# Patient Record
Sex: Female | Born: 1975 | Race: White | Hispanic: No | Marital: Married | Smoking: Never smoker
Health system: Southern US, Community
[De-identification: ages and names within clinical notes are randomized; demographics above are authoritative.]

## PROBLEM LIST (undated history)

## (undated) ENCOUNTER — Ambulatory Visit

## (undated) HISTORY — PX: OTHER SURGICAL HISTORY: SHX169

---

## 2002-07-08 ENCOUNTER — Other Ambulatory Visit: Admission: RE | Admit: 2002-07-08 | Discharge: 2002-07-08 | Payer: Self-pay | Admitting: *Deleted

## 2002-07-24 ENCOUNTER — Encounter: Payer: Self-pay | Admitting: *Deleted

## 2002-07-24 ENCOUNTER — Ambulatory Visit (HOSPITAL_COMMUNITY): Admission: RE | Admit: 2002-07-24 | Discharge: 2002-07-24 | Payer: Self-pay | Admitting: *Deleted

## 2007-05-23 ENCOUNTER — Ambulatory Visit (HOSPITAL_COMMUNITY): Admission: RE | Admit: 2007-05-23 | Discharge: 2007-05-23 | Payer: Self-pay | Admitting: Specialist

## 2007-06-19 ENCOUNTER — Ambulatory Visit (HOSPITAL_COMMUNITY): Admission: RE | Admit: 2007-06-19 | Discharge: 2007-06-19 | Payer: Self-pay | Admitting: Specialist

## 2016-01-08 DIAGNOSIS — B977 Papillomavirus as the cause of diseases classified elsewhere: Secondary | ICD-10-CM | POA: Insufficient documentation

## 2016-01-08 DIAGNOSIS — N879 Dysplasia of cervix uteri, unspecified: Secondary | ICD-10-CM | POA: Insufficient documentation

## 2016-01-08 DIAGNOSIS — G4700 Insomnia, unspecified: Secondary | ICD-10-CM | POA: Insufficient documentation

## 2016-01-08 DIAGNOSIS — F3340 Major depressive disorder, recurrent, in remission, unspecified: Secondary | ICD-10-CM | POA: Insufficient documentation

## 2016-07-08 DIAGNOSIS — E282 Polycystic ovarian syndrome: Secondary | ICD-10-CM | POA: Insufficient documentation

## 2017-11-02 ENCOUNTER — Other Ambulatory Visit: Payer: Self-pay | Admitting: Obstetrics and Gynecology

## 2017-11-02 DIAGNOSIS — R928 Other abnormal and inconclusive findings on diagnostic imaging of breast: Secondary | ICD-10-CM

## 2017-11-07 ENCOUNTER — Other Ambulatory Visit: Payer: Self-pay | Admitting: Obstetrics and Gynecology

## 2017-11-07 ENCOUNTER — Ambulatory Visit
Admission: RE | Admit: 2017-11-07 | Discharge: 2017-11-07 | Disposition: A | Discharge status: Home / Self Care | Payer: BC Managed Care – PPO | Source: Ambulatory Visit | Attending: Obstetrics and Gynecology | Admitting: Obstetrics and Gynecology

## 2017-11-07 DIAGNOSIS — R928 Other abnormal and inconclusive findings on diagnostic imaging of breast: Secondary | ICD-10-CM

## 2017-11-07 DIAGNOSIS — N6489 Other specified disorders of breast: Secondary | ICD-10-CM

## 2018-05-11 ENCOUNTER — Ambulatory Visit: Admission: RE | Admit: 2018-05-11 | Payer: Self-pay | Source: Ambulatory Visit

## 2018-05-11 ENCOUNTER — Encounter: Payer: Self-pay | Admitting: Radiology

## 2018-05-11 ENCOUNTER — Ambulatory Visit
Admission: RE | Admit: 2018-05-11 | Discharge: 2018-05-11 | Disposition: A | Discharge status: Home / Self Care | Payer: BC Managed Care – PPO | Source: Ambulatory Visit | Attending: Obstetrics and Gynecology | Admitting: Obstetrics and Gynecology

## 2018-05-11 DIAGNOSIS — N6489 Other specified disorders of breast: Secondary | ICD-10-CM

## 2019-08-20 DIAGNOSIS — H00022 Hordeolum internum right lower eyelid: Secondary | ICD-10-CM | POA: Insufficient documentation

## 2020-01-09 ENCOUNTER — Other Ambulatory Visit: Payer: Self-pay | Admitting: Obstetrics and Gynecology

## 2020-01-09 DIAGNOSIS — Z1231 Encounter for screening mammogram for malignant neoplasm of breast: Secondary | ICD-10-CM

## 2020-01-29 ENCOUNTER — Ambulatory Visit
Admission: RE | Admit: 2020-01-29 | Discharge: 2020-01-29 | Disposition: A | Discharge status: Home / Self Care | Payer: BC Managed Care – PPO | Source: Ambulatory Visit | Attending: Obstetrics and Gynecology | Admitting: Obstetrics and Gynecology

## 2020-01-29 ENCOUNTER — Other Ambulatory Visit: Payer: Self-pay

## 2020-01-29 DIAGNOSIS — Z1231 Encounter for screening mammogram for malignant neoplasm of breast: Secondary | ICD-10-CM

## 2021-03-09 ENCOUNTER — Other Ambulatory Visit: Payer: Self-pay | Admitting: Obstetrics and Gynecology

## 2021-03-09 DIAGNOSIS — Z1231 Encounter for screening mammogram for malignant neoplasm of breast: Secondary | ICD-10-CM

## 2021-03-19 DIAGNOSIS — G9332 Myalgic encephalomyelitis/chronic fatigue syndrome: Secondary | ICD-10-CM | POA: Insufficient documentation

## 2021-04-15 ENCOUNTER — Ambulatory Visit
Admission: RE | Admit: 2021-04-15 | Discharge: 2021-04-15 | Disposition: A | Discharge status: Home / Self Care | Payer: BC Managed Care – PPO | Source: Ambulatory Visit | Attending: Obstetrics and Gynecology | Admitting: Obstetrics and Gynecology

## 2021-04-15 ENCOUNTER — Other Ambulatory Visit: Payer: Self-pay

## 2021-04-15 DIAGNOSIS — Z1231 Encounter for screening mammogram for malignant neoplasm of breast: Secondary | ICD-10-CM

## 2022-03-02 ENCOUNTER — Other Ambulatory Visit: Payer: Self-pay | Admitting: Obstetrics and Gynecology

## 2022-03-02 DIAGNOSIS — Z1231 Encounter for screening mammogram for malignant neoplasm of breast: Secondary | ICD-10-CM

## 2022-04-18 ENCOUNTER — Ambulatory Visit
Admission: RE | Admit: 2022-04-18 | Discharge: 2022-04-18 | Disposition: A | Discharge status: Home / Self Care | Payer: BC Managed Care – PPO | Source: Ambulatory Visit | Attending: Obstetrics and Gynecology | Admitting: Obstetrics and Gynecology

## 2022-04-18 DIAGNOSIS — Z1231 Encounter for screening mammogram for malignant neoplasm of breast: Secondary | ICD-10-CM

## 2022-12-15 ENCOUNTER — Encounter: Payer: Self-pay | Admitting: Internal Medicine

## 2022-12-16 DIAGNOSIS — M25561 Pain in right knee: Secondary | ICD-10-CM | POA: Insufficient documentation

## 2023-01-06 ENCOUNTER — Ambulatory Visit (AMBULATORY_SURGERY_CENTER): Payer: BC Managed Care – PPO

## 2023-01-06 ENCOUNTER — Encounter: Payer: Self-pay | Admitting: Internal Medicine

## 2023-01-06 VITALS — Ht 64.0 in | Wt 184.0 lb

## 2023-01-06 DIAGNOSIS — Z1211 Encounter for screening for malignant neoplasm of colon: Secondary | ICD-10-CM

## 2023-01-06 MED ORDER — NA SULFATE-K SULFATE-MG SULF 17.5-3.13-1.6 GM/177ML PO SOLN: 1.0000 | Freq: Once | ORAL | 0 refills | Status: AC

## 2023-01-06 NOTE — Progress Notes (Signed)
No egg or soy allergy known to patient  No issues known to pt with past sedation with any surgeries or procedures Patient denies ever being told they had issues or difficulty with intubation  No FH of Malignant Hyperthermia Pt is not on diet pills Pt is not on  home 02  Pt is not on blood thinners  Pt denies issues with constipation  No A fib or A flutter Have any cardiac testing pending--no  LOA: independent Prep: suprep   Patient's chart reviewed by John Nulty CNRA prior to previsit and patient appropriate for the [facility].  Previsit completed and red dot placed by patient's name on their procedure day (on provider's schedule).     PV competed with patient. Prep instructions sent via mychart and home address. Goodrx coupon for [facility] provided to use for price reduction if needed.  

## 2023-01-17 ENCOUNTER — Ambulatory Visit (AMBULATORY_SURGERY_CENTER): Payer: BC Managed Care – PPO | Admitting: Internal Medicine

## 2023-01-17 ENCOUNTER — Encounter: Payer: Self-pay | Admitting: Internal Medicine

## 2023-01-17 VITALS — BP 92/60 | HR 60 | Temp 97.5°F | Resp 20 | Ht 64.0 in | Wt 184.0 lb

## 2023-01-17 DIAGNOSIS — Z1211 Encounter for screening for malignant neoplasm of colon: Secondary | ICD-10-CM | POA: Diagnosis not present

## 2023-01-17 DIAGNOSIS — D123 Benign neoplasm of transverse colon: Secondary | ICD-10-CM

## 2023-01-17 MED ORDER — SODIUM CHLORIDE 0.9 % IV SOLN: 500.0000 mL | Freq: Once | INTRAVENOUS | Status: DC

## 2023-01-17 NOTE — Progress Notes (Signed)
 Called to room to assist during endoscopic procedure.  Patient ID and intended procedure confirmed with present staff. Received instructions for my participation in the procedure from the performing physician.  

## 2023-01-17 NOTE — Progress Notes (Signed)
 A and O x3. Report to RN. Tolerated MAC anesthesia well.

## 2023-01-17 NOTE — Op Note (Signed)
[facility] Patient Name: Meghan King Procedure Date: 01/17/2023 2:21 PM MRN: [id number] Endoscopist: Madelyn Brunner [place] , , [id number] Age: 47 Referring MD:  Date of Birth: 03-07-1976 Gender: Female Account #: [id number] Procedure:                Colonoscopy Indications:              Screening for colorectal malignant neoplasm, This                            is the patient's first colonoscopy Medicines:                Monitored Anesthesia Care Procedure:                Pre-Anesthesia Assessment:                           - Prior to the procedure, a History and Physical                            was performed, and patient medications and                            allergies were reviewed. The patient's tolerance of                            previous anesthesia was also reviewed. The risks                            and benefits of the procedure and the sedation                            options and risks were discussed with the patient.                            All questions were answered, and informed consent                            was obtained. Prior Anticoagulants: The patient has                            taken no anticoagulant or antiplatelet agents. ASA                            Grade Assessment: II - A patient with mild systemic                            disease. After reviewing the risks and benefits,                            the patient was deemed in satisfactory condition to                            undergo the procedure.  After obtaining informed consent, the colonoscope                            was passed under direct vision. Throughout the                            procedure, the patient's blood pressure, pulse, and                            oxygen saturations were monitored continuously. The                            Olympus CF-HQ190L [id number]) Colonoscope was                            introduced through  the anus and advanced to the the                            terminal ileum. The colonoscopy was performed                            without difficulty. The patient tolerated the                            procedure well. The quality of the bowel                            preparation was good. The terminal ileum, ileocecal                            valve, appendiceal orifice, and rectum were                            photographed. Scope In: 2:25:01 PM Scope Out: 2:40:05 PM Scope Withdrawal Time: 0 hours 12 minutes 23 seconds  Total Procedure Duration: 0 hours 15 minutes 4 seconds  Findings:                 The terminal ileum appeared normal.                           A 3 mm polyp was found in the transverse colon. The                            polyp was sessile. The polyp was removed with a                            cold snare. Resection and retrieval were complete.                           Non-bleeding internal hemorrhoids were found during                            retroflexion. Complications:  No immediate complications. Estimated Blood Loss:     Estimated blood loss was minimal. Impression:               - The examined portion of the ileum was normal.                           - One 3 mm polyp in the transverse colon, removed                            with a cold snare. Resected and retrieved.                           - Non-bleeding internal hemorrhoids. Recommendation:           - Discharge patient to home (with escort).                           - Await pathology results.                           - The findings and recommendations were discussed                            with the patient. Dr Particia Lather "Alan Ripper" [place],  01/17/2023 2:42:09 PM

## 2023-01-17 NOTE — Progress Notes (Signed)
   GASTROENTEROLOGY PROCEDURE H&P NOTE   Primary Care Physician: Olive Bass, MD    Reason for Procedure:   Colon cancer screening  Plan:    Colonoscopy  Patient is appropriate for endoscopic procedure(s) in the ambulatory ([facility]) setting.  The nature of the procedure, as well as the risks, benefits, and alternatives were carefully and thoroughly reviewed with the patient. Ample time for discussion and questions allowed. The patient understood, was satisfied, and agreed to proceed.     HPI: Meghan King is a 47 y.o. female who presents for colonoscopy for colon cancer screening. Denies blood in stools, changes in bowel habits, or unintentional weight loss. Denies family history of colon cancer.  History reviewed. No pertinent past medical history.  Past Surgical History:  Procedure Laterality Date   CESAREAN SECTION     Ovarian wedge resection       Prior to Admission medications   Medication Sig Start Date End Date Taking? Authorizing Provider  estrogen, conjugated,-medroxyprogesterone (PREMPRO) 0.3-1.5 MG tablet Take 1 tablet by mouth daily. Patient not taking: Reported on 01/06/2023 12/28/21   [provider]    Current Outpatient Medications  Medication Sig Dispense Refill   estrogen, conjugated,-medroxyprogesterone (PREMPRO) 0.3-1.5 MG tablet Take 1 tablet by mouth daily. (Patient not taking: Reported on 01/06/2023)     Current Facility-Administered Medications  Medication Dose Route Frequency Provider Last Rate Last Admin   0.9 %  sodium chloride infusion  500 mL Intravenous Once Imogene Burn, MD        Allergies as of 01/17/2023   (No Known Allergies)    Family History  Problem Relation Age of Onset   Colon polyps Mother    Breast cancer Neg Hx    Colon cancer Neg Hx    Esophageal cancer Neg Hx    Rectal cancer Neg Hx    Stomach cancer Neg Hx     Social History   Socioeconomic History   Marital status: Married    Spouse name: Not on  file   Number of children: Not on file   Years of education: Not on file   Highest education level: Not on file  Occupational History   Not on file  Tobacco Use   Smoking status: Never   Smokeless tobacco: Never  Vaping Use   Vaping status: Never Used  Substance and Sexual Activity   Alcohol use: Yes    Comment: social   Drug use: Never   Sexual activity: Yes  Other Topics Concern   Not on file  Social History Narrative   Not on file   Social Determinants of Health   Financial Resource Strain: Not on file  Food Insecurity: Not on file  Transportation Needs: Not on file  Physical Activity: Not on file  Stress: Not on file  Social Connections: Not on file  Intimate Partner Violence: Not on file    Physical Exam: Vital signs in last 24 hours: BP 115/70   Pulse 65   Temp (!) 97.5 F (36.4 C) (Temporal)   Ht 5\' 4"  (1.626 m)   Wt 184 lb (83.5 kg)   SpO2 99%   BMI 31.58 kg/m  GEN: NAD EYE: Sclerae anicteric ENT: MMM CV: Non-tachycardic Pulm: No increased work of breathing GI: Soft, NT/ND NEURO:  Alert & Oriented   Eulah Pont, MD [facility]  01/17/2023 2:12 PM

## 2023-01-17 NOTE — Patient Instructions (Addendum)
Pathology results will be ready in approximately 7-10 days. Next colonoscopy will be scheduled based on what pathology shows.  YOU HAD AN ENDOSCOPIC PROCEDURE TODAY AT THE [facility]:   Refer to the procedure report that was given to you for any specific questions about what was found during the examination.  If the procedure report does not answer your questions, please call your gastroenterologist to clarify.  If you requested that your care partner not be given the details of your procedure findings, then the procedure report has been included in a sealed envelope for you to review at your convenience later.  YOU SHOULD EXPECT: Some feelings of bloating in the abdomen. Passage of more gas than usual.  Walking can help get rid of the air that was put into your GI tract during the procedure and reduce the bloating. If you had a lower endoscopy (such as a colonoscopy or flexible sigmoidoscopy) you may notice spotting of blood in your stool or on the toilet paper. If you underwent a bowel prep for your procedure, you may not have a normal bowel movement for a few days.  Please Note:  You might notice some irritation and congestion in your nose or some drainage.  This is from the oxygen used during your procedure.  There is no need for concern and it should clear up in a day or so.  SYMPTOMS TO REPORT IMMEDIATELY:  Following lower endoscopy (colonoscopy or flexible sigmoidoscopy):  Excessive amounts of blood in the stool  Significant tenderness or worsening of abdominal pains  Swelling of the abdomen that is new, acute  Fever of 100F or higher  For urgent or emergent issues, a gastroenterologist can be reached at any hour by calling [phone]. Do not use MyChart messaging for urgent concerns.    DIET:  We do recommend a small meal at first, but then you may proceed to your regular diet.  Drink plenty of fluids but you should avoid alcoholic beverages for 24 hours.  ACTIVITY:   You should plan to take it easy for the rest of today and you should NOT DRIVE or use heavy machinery until tomorrow (because of the sedation medicines used during the test).    FOLLOW UP: Our staff will call the number listed on your records the next business day following your procedure.  We will call around 7:15- 8:00 am to check on you and address any questions or concerns that you may have regarding the information given to you following your procedure. If we do not reach you, we will leave a message.     If any biopsies were taken you will be contacted by phone or by letter within the next 1-3 weeks.  Please call us at [phone] if you have not heard about the biopsies in 3 weeks.    SIGNATURES/CONFIDENTIALITY: You and/or your care partner have signed paperwork which will be entered into your electronic medical record.  These signatures attest to the fact that that the information above on your After Visit Summary has been reviewed and is understood.  Full responsibility of the confidentiality of this discharge information lies with you and/or your care-partner.

## 2023-01-17 NOTE — Progress Notes (Signed)
VS completed by RY  Pt's states no medical or surgical changes since previsit or office visit.

## 2023-01-18 ENCOUNTER — Telehealth: Payer: Self-pay

## 2023-01-18 NOTE — Telephone Encounter (Signed)
  Follow up Call-     01/17/2023    1:42 PM  Call back number  Post procedure Call Back phone  # [phone]  Permission to leave phone message Yes     Patient questions:  Do you have a fever, pain , or abdominal swelling? No. Pain Score  0 *  Have you tolerated food without any problems? Yes.    Have you been able to return to your normal activities? Yes.    Do you have any questions about your discharge instructions: Diet   No. Medications  No. Follow up visit  No.  Do you have questions or concerns about your Care? No.  Actions: * If pain score is 4 or above: No action needed, pain <4.

## 2023-01-23 ENCOUNTER — Encounter: Payer: Self-pay | Admitting: Internal Medicine

## 2023-03-07 ENCOUNTER — Other Ambulatory Visit: Payer: Self-pay | Admitting: Obstetrics and Gynecology

## 2023-03-07 DIAGNOSIS — Z1231 Encounter for screening mammogram for malignant neoplasm of breast: Secondary | ICD-10-CM

## 2023-04-20 ENCOUNTER — Ambulatory Visit
Admission: RE | Admit: 2023-04-20 | Discharge: 2023-04-20 | Disposition: A | Discharge status: Home / Self Care | Payer: BC Managed Care – PPO | Source: Ambulatory Visit | Attending: Obstetrics and Gynecology | Admitting: Obstetrics and Gynecology

## 2023-04-20 DIAGNOSIS — Z1231 Encounter for screening mammogram for malignant neoplasm of breast: Secondary | ICD-10-CM

## 2023-07-04 IMAGING — MG MM DIGITAL SCREENING BILAT W/ TOMO AND CAD
8 series · 8 of 24 positions shown · non-contrast
Comparison: Previous exam(s).

CLINICAL DATA: Screening.

EXAM:
DIGITAL SCREENING BILATERAL MAMMOGRAM WITH TOMOSYNTHESIS AND CAD
TECHNIQUE: Bilateral screening digital craniocaudal and mediolateral oblique
mammograms were obtained. Bilateral screening digital breast
tomosynthesis was performed. The images were evaluated with
computer-aided detection.

[R CC synth-2D]
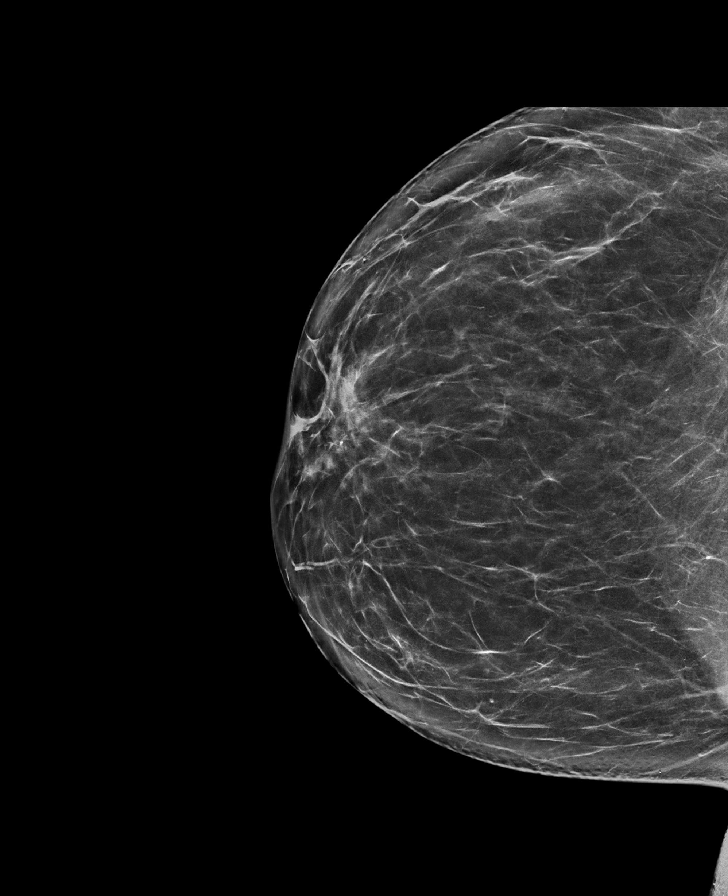

[L CC synth-2D]
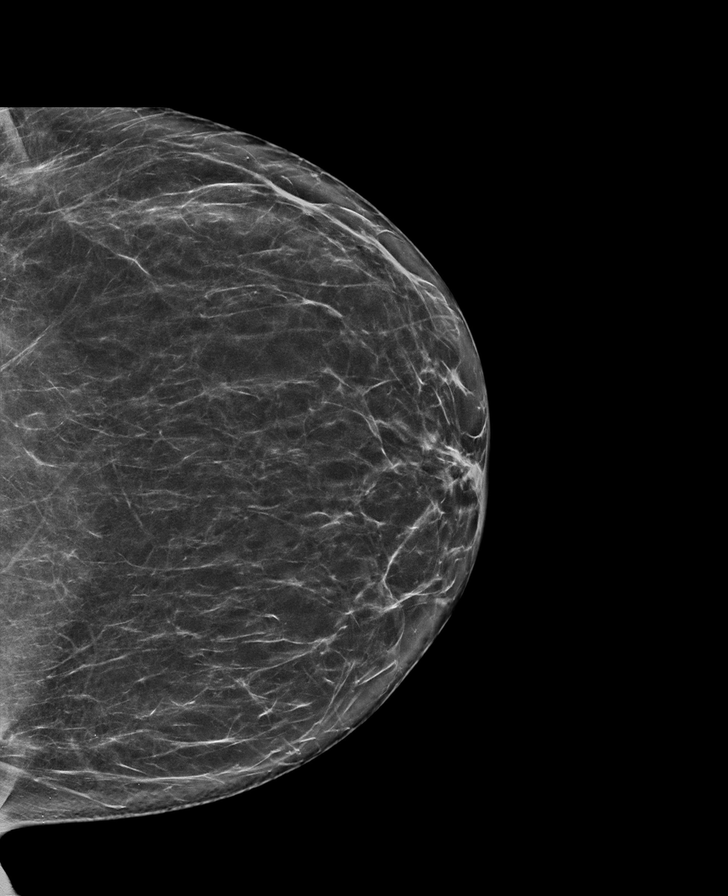

[L MLO synth-2D]
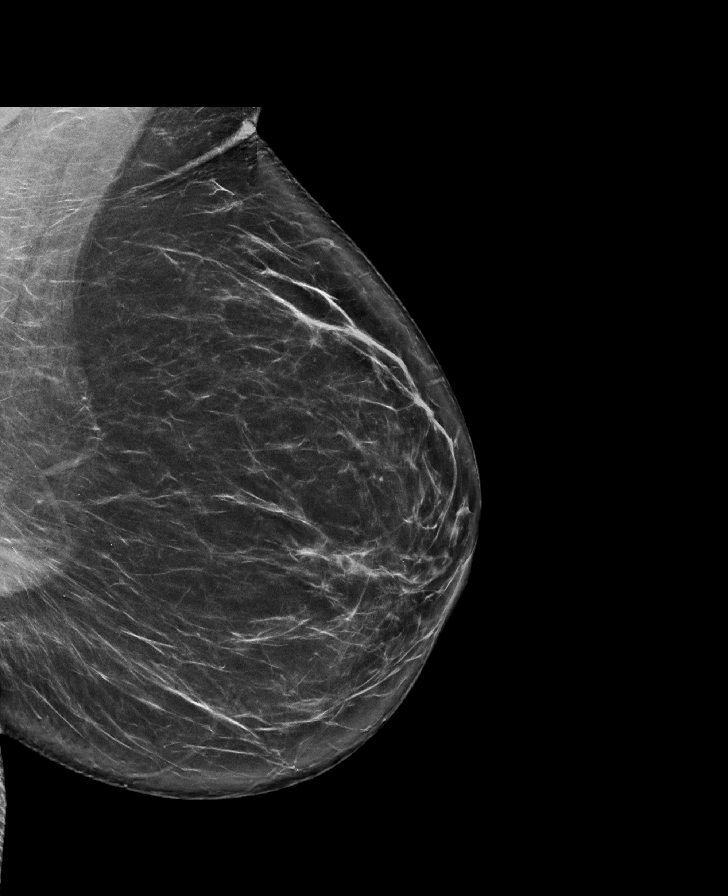

[R MLO synth-2D]
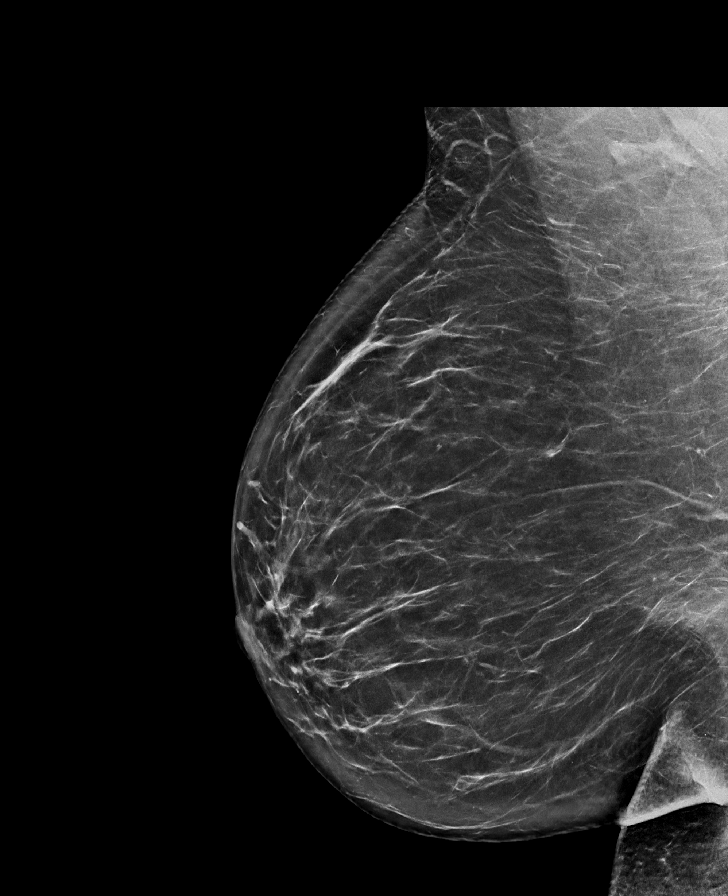

[R CC tomo · tomo slice 40/79.0]
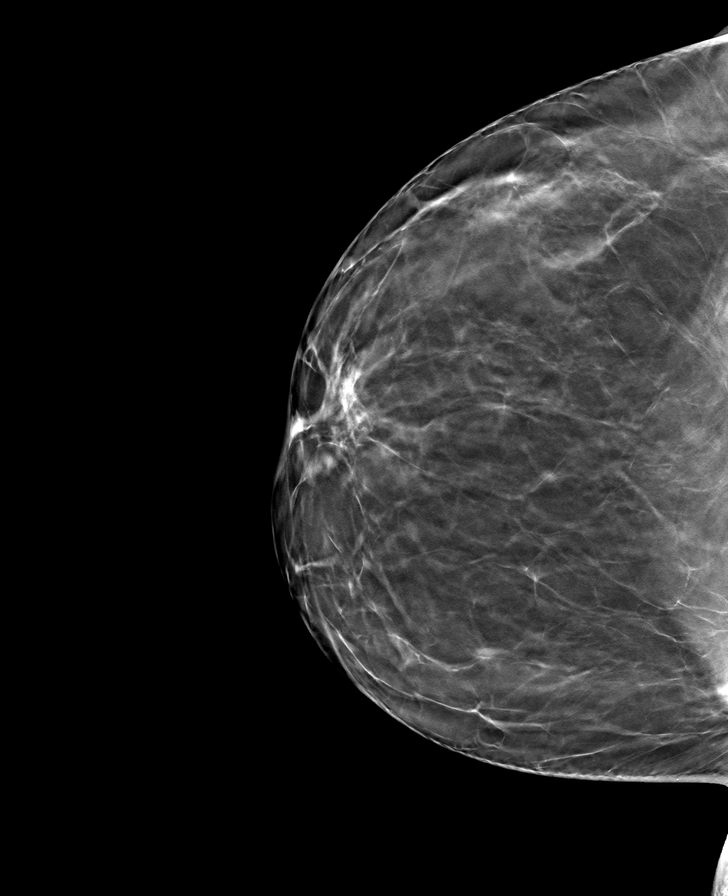

[L CC tomo · tomo slice 39/77.0]
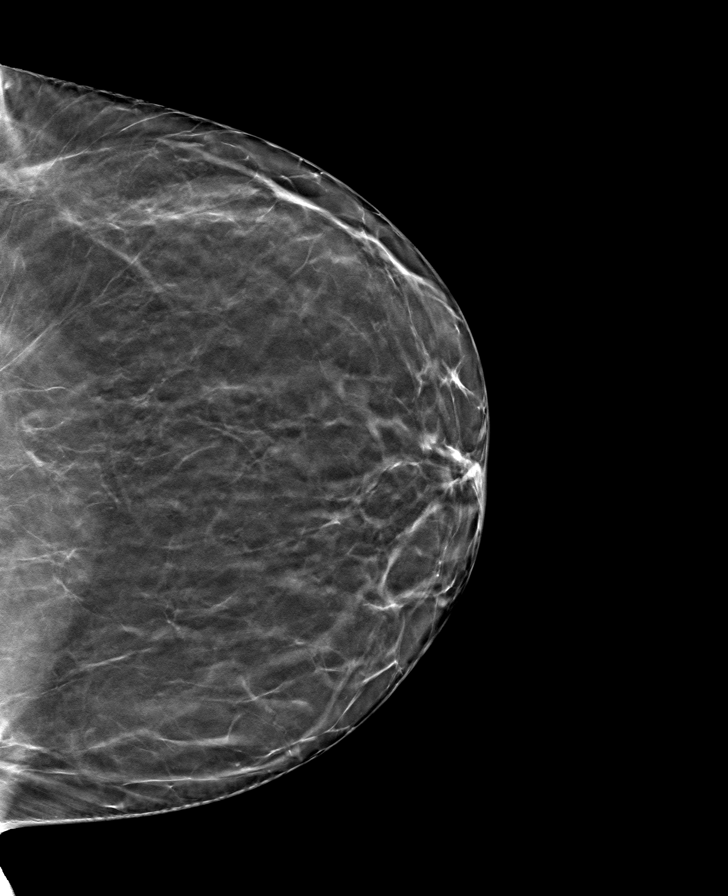

[L MLO tomo · tomo slice 45/89.0]
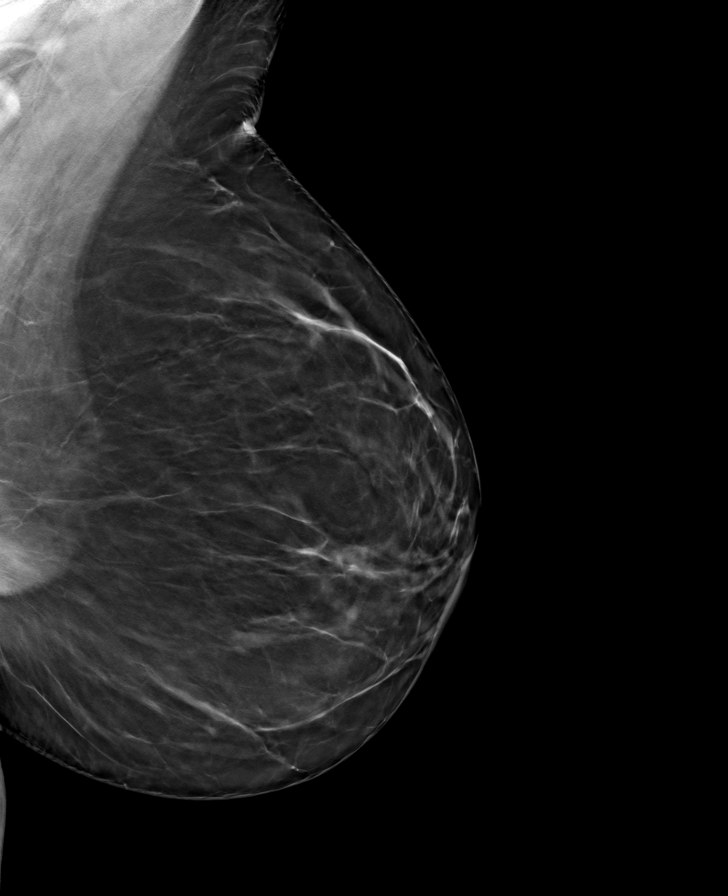

[R MLO tomo · tomo slice 46/91.0]
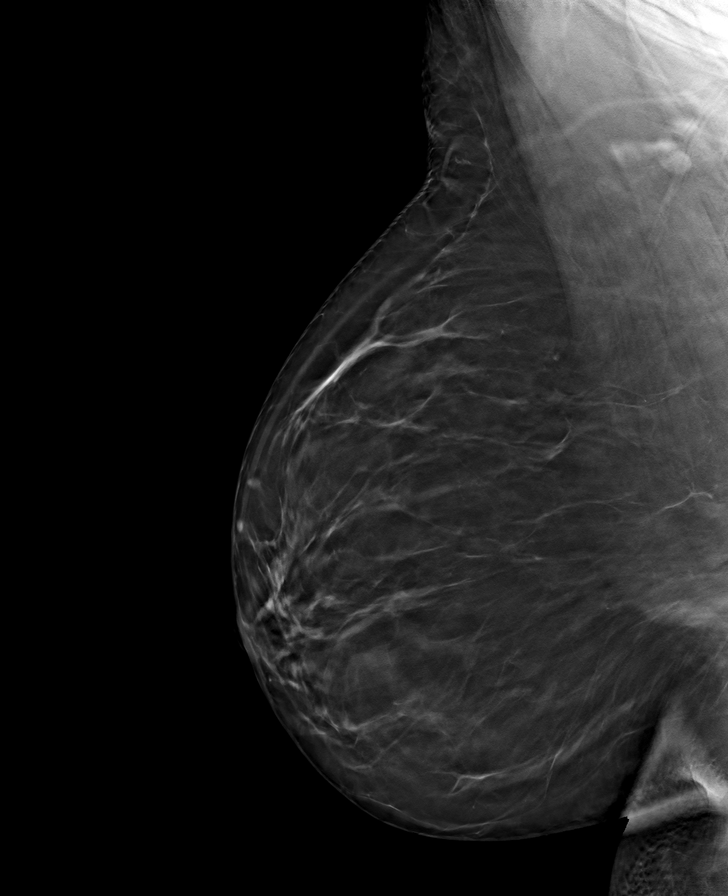

[8 of 24 positions shown; findings below may reference images not displayed]

ACR Breast Density Category b: There are scattered areas of
fibroglandular density.
FINDINGS: There are no findings suspicious for malignancy.
IMPRESSION: No mammographic evidence of malignancy. A result letter of this
screening mammogram will be mailed directly to the patient.

RECOMMENDATION:
Screening mammogram in one year. (Code:51-O-LD2)

BI-RADS CATEGORY  1: Negative.

## 2023-08-12 ENCOUNTER — Encounter (HOSPITAL_BASED_OUTPATIENT_CLINIC_OR_DEPARTMENT_OTHER): Payer: Self-pay | Admitting: Emergency Medicine

## 2023-08-12 ENCOUNTER — Ambulatory Visit (HOSPITAL_BASED_OUTPATIENT_CLINIC_OR_DEPARTMENT_OTHER)
Admission: EM | Admit: 2023-08-12 | Discharge: 2023-08-12 | Disposition: A | Discharge status: Home / Self Care | Payer: 59 | Attending: Family Medicine | Admitting: Family Medicine

## 2023-08-12 DIAGNOSIS — H0014 Chalazion left upper eyelid: Secondary | ICD-10-CM

## 2023-08-12 MED ORDER — ERYTHROMYCIN 5 MG/GM OP OINT: TOPICAL_OINTMENT | OPHTHALMIC | 0 refills | Status: AC

## 2023-08-12 NOTE — ED Provider Notes (Signed)
 [facility]    CSN: [id number] Arrival date & time: 08/12/23  0943      History   Chief Complaint Chief Complaint  Patient presents with   Facial Swelling    HPI Meghan King is a 48 y.o. female.   Patient c/o left eye swelling x 3 days, some itching, no injury.  Denies any visual problems.  Patient has been putting OTC stye eyedrops, applying cold compresses and a tea bag on eye w/o improvement.       History reviewed. No pertinent past medical history.  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      Home Medications    Prior to Admission medications   Medication Sig Start Date End Date Taking? Authorizing Provider  erythromycin  ophthalmic ointment Place a 1/4 inch ribbon of ointment into the lower eyelid twice daily x 5-7 days 08/12/23  Yes Ival Domino, FNP    Family History Family History  Problem Relation Age of Onset   Colon polyps Mother    Healthy Father    Breast cancer Neg Hx    Colon cancer Neg Hx    Esophageal cancer Neg Hx    Rectal cancer Neg Hx    Stomach cancer Neg Hx     Social History Social History   Tobacco Use   Smoking status: Never   Smokeless tobacco: Never  Vaping Use   Vaping status: Never Used  Substance Use Topics   Alcohol use: Yes    Comment: social   Drug use: Never     Allergies   Patient has no known allergies.   Review of Systems Review of Systems  Constitutional:  Negative for  chills and fever.  HENT:  Negative for ear pain and sore throat.   Eyes:  Positive for pain and redness (With swelling of eyelids.). Negative for visual disturbance.  Respiratory:  Negative for cough and shortness of breath.   Cardiovascular:  Negative for chest pain and palpitations.  Gastrointestinal:  Negative for abdominal pain, constipation, diarrhea, nausea and vomiting.  Genitourinary:  Negative for dysuria and hematuria.  Musculoskeletal:  Negative for arthralgias and back pain.  Skin:  Negative for color change and rash.  Neurological:  Negative for seizures and syncope.  All other systems reviewed and are negative.    Physical Exam Triage Vital Signs ED Triage Vitals  Encounter Vitals Group     BP 08/12/23 1131 106/76     Systolic BP Percentile --      Diastolic BP Percentile --      Pulse Rate 08/12/23 1131 63     Resp 08/12/23 1131 18     Temp 08/12/23 1131 98.3 F (36.8 C)     Temp Source 08/12/23 1131 Oral     SpO2 08/12/23 1131 95 %     Weight 08/12/23 1133 192 lb (87.1 kg)     Height 08/12/23 1133 5' 4 (1.626 m)  Head Circumference --      Peak Flow --      Pain Score 08/12/23 1132 0     Pain Loc --      Pain Education --      Exclude from Growth Chart --    No data found.  Updated Vital Signs BP 106/76 (BP Location: Right Arm)   Pulse 63   Temp 98.3 F (36.8 C) (Oral)   Resp 18   Ht 5' 4 (1.626 m)   Wt 192 lb (87.1 kg)   SpO2 95%   BMI 32.96 kg/m   Visual Acuity Right Eye Distance: 20 25 Left Eye Distance: 20 20 Bilateral Distance: 20 20  Right Eye Near:   Left Eye Near:    Bilateral Near:     Physical Exam Vitals and nursing note reviewed.  Constitutional:      General: She is not in acute distress.    Appearance: She is well-developed. She is not ill-appearing or toxic-appearing.  HENT:     Head: Normocephalic and atraumatic.     Right Ear: Hearing, tympanic membrane, ear canal and external ear normal.     Left Ear: Hearing,  tympanic membrane, ear canal and external ear normal.     Nose: Nose normal.     Mouth/Throat:     Lips: Pink.     Mouth: Mucous membranes are moist.  Eyes:     General:        Left eye: Discharge and hordeolum present.    Conjunctiva/sclera: Conjunctivae normal.     Pupils: Pupils are equal, round, and reactive to light.  Cardiovascular:     Rate and Rhythm: Normal rate and regular rhythm.     Heart sounds: S1 normal and S2 normal. No murmur heard. Pulmonary:     Effort: Pulmonary effort is normal. No respiratory distress.     Breath sounds: No decreased breath sounds, wheezing, rhonchi or rales.  Musculoskeletal:        General: No swelling.     Cervical back: Neck supple.  Lymphadenopathy:     Head:     Right side of head: No submental, submandibular, tonsillar, preauricular or posterior auricular adenopathy.     Left side of head: No submental, submandibular, tonsillar, preauricular or posterior auricular adenopathy.     Cervical: No cervical adenopathy.     Right cervical: No superficial cervical adenopathy.    Left cervical: No superficial cervical adenopathy.  Skin:    General: Skin is warm and dry.     Capillary Refill: Capillary refill takes less than 2 seconds.     Findings: No rash.  Neurological:     Mental Status: She is alert and oriented to person, place, and time.  Psychiatric:        Mood and Affect: Mood normal.      UC Treatments / Results  Labs (all labs ordered are listed, but only abnormal results are displayed) Labs Reviewed - No data to display  EKG   Radiology No results found.  Procedures Procedures (including critical care time)  Medications Ordered in UC Medications - No data to display  Initial Impression / Assessment and Plan / UC Course  I have reviewed the triage vital signs and the nursing notes.  Pertinent labs & imaging results that were available during my care of the patient were reviewed by me and considered in my medical  decision making (see chart for details).     Exam is most consistent  with a shoelace again of the left upper eyelid.  Educated about eye washing with  Soft cotton disposable pad or a soft baby washcloth and [employer] baby shampoo, wash the eye.  If you used a washcloth that needs to be longer daily.  Follow-up if symptoms do not improve, worsen or new symptoms occur. Final Clinical Impressions(s) / UC Diagnoses   Final diagnoses:  Chalazion left upper eyelid     Discharge Instructions      There appears to be a open draining show lazy M very small under the upper left eyelid.  Will treat with erythromycin  ointment, a quarter of an inch, into the eye, twice daily for 5 to 7 days.  May perform eye washes with soft cotton pads or baby washcloths, and [employer] baby shampoo, once daily.  If you use a washcloth you need to use a new washcloth every eye wash.  Follow-up if symptoms do not improve, worsen or new symptoms occur.     ED Prescriptions     Medication Sig Dispense Auth. Provider   erythromycin  ophthalmic ointment Place a 1/4 inch ribbon of ointment into the lower eyelid twice daily x 5-7 days 3.5 g Ival Domino, FNP      PDMP not reviewed this encounter.   Ival Domino, FNP 08/12/23 1216

## 2023-08-12 NOTE — ED Triage Notes (Signed)
 Patient c/o left eye swelling x 3 days, some itching, no injury.  Denies any visual problems.  Patient has been putting OTC stye eyedrops, applying cold compresses and a tea bag on eye w/o improvement.

## 2023-08-12 NOTE — Discharge Instructions (Signed)
 There appears to be a open draining show lazy M very small under the upper left eyelid.  Will treat with erythromycin  ointment, a quarter of an inch, into the eye, twice daily for 5 to 7 days.  May perform eye washes with soft cotton pads or baby washcloths, and [employer] baby shampoo, once daily.  If you use a washcloth you need to use a new washcloth every eye wash.  Follow-up if symptoms do not improve, worsen or new symptoms occur.

## 2023-09-25 ENCOUNTER — Ambulatory Visit (HOSPITAL_BASED_OUTPATIENT_CLINIC_OR_DEPARTMENT_OTHER)
Admission: RE | Admit: 2023-09-25 | Discharge: 2023-09-25 | Disposition: A | Discharge status: Home / Self Care | Source: Ambulatory Visit | Attending: Family Medicine | Admitting: Family Medicine

## 2023-09-25 ENCOUNTER — Encounter (HOSPITAL_BASED_OUTPATIENT_CLINIC_OR_DEPARTMENT_OTHER): Payer: Self-pay

## 2023-09-25 VITALS — BP 118/82 | HR 59 | Temp 97.8°F | Resp 20

## 2023-09-25 DIAGNOSIS — J22 Unspecified acute lower respiratory infection: Secondary | ICD-10-CM | POA: Diagnosis not present

## 2023-09-25 DIAGNOSIS — R21 Rash and other nonspecific skin eruption: Secondary | ICD-10-CM

## 2023-09-25 MED ORDER — TRIAMCINOLONE ACETONIDE 0.1 % EX CREA: 1.0000 | TOPICAL_CREAM | Freq: Two times a day (BID) | CUTANEOUS | 0 refills | Status: AC

## 2023-09-25 MED ORDER — AZITHROMYCIN 250 MG PO TABS: 250.0000 mg | ORAL_TABLET | Freq: Every day | ORAL | 0 refills | Status: AC

## 2023-09-25 NOTE — Discharge Instructions (Signed)
 Treating for possible you.  Take the antibiotics as prescribed.  Also given you a steroid cream to help with the bumps and itching.  You can take a daily allergy pill to help with the itching such as cetirizine or Zyrtec  follow-up for any continued or worsening issues

## 2023-09-25 NOTE — ED Triage Notes (Signed)
 States felt unwell approx 2 weeks ago. Was not evaluated as she states she was just fatigued and achey. States 3 days ago, started coughing and states when laying down at night, can hear a "popping" sound when breathing. Also states skin is "itchy". No hx of seasonal allergies or asthma. Patient states recently changed laundry detergent and body soap.

## 2023-09-26 NOTE — ED Provider Notes (Signed)
 [facility]    CSN: [id number] Arrival date & time: 09/25/23  1708      History   Chief Complaint Chief Complaint  Patient presents with   Wheezing    Crackling sound from throat when I exhale.  It's been going on for about 3 days or so. - Entered by patient    HPI Meghan King is a 48 y.o. female.   Patient is a 47-year female presents today with cough, congestion, wheezing.  Started as some virus about 2 weeks ago and has not improved.  She reports she can hear herself wheezing when she lays down.  She has been fatigued and achy.  No fever, chills at this time. She has also had rash to skin and itching.  Did recently change her soap and laundry detergent.   Wheezing Associated symptoms: rash     History reviewed. No pertinent past medical history.  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      Home Medications    Prior to Admission medications   Medication Sig Start Date End Date Taking? Authorizing Provider  azithromycin (ZITHROMAX) 250 MG tablet Take 1 tablet (250 mg total) by mouth daily. Take first 2 tablets together, then 1 every day until finished. 09/25/23  Yes Suzzane Quilter A, FNP  triamcinolone cream (KENALOG) 0.1 % Apply 1 Application topically 2 (two) times daily. 09/25/23  Yes Dahlia Byes A, FNP  erythromycin ophthalmic ointment Place a 1/4 inch ribbon of ointment into the lower eyelid twice daily x 5-7 days 08/12/23   Prescilla Sours, FNP    Family History Family History  Problem Relation Age of Onset   Colon polyps Mother    Healthy Father    Breast cancer Neg Hx    Colon cancer Neg Hx    Esophageal cancer Neg Hx    Rectal cancer Neg Hx    Stomach cancer Neg Hx     Social History Social History   Tobacco Use   Smoking status: Never   Smokeless tobacco: Never  Vaping Use   Vaping status: Never Used  Substance Use Topics   Alcohol use: Yes    Comment: social   Drug use: Never     Allergies   Patient has no known allergies.   Review of Systems Review of Systems  Respiratory:  Positive for wheezing.   Skin:  Positive for rash.     Physical Exam Triage Vital Signs ED Triage Vitals  Encounter Vitals Group     BP 09/25/23 1729 118/82     Systolic BP Percentile --      Diastolic BP Percentile --      Pulse Rate 09/25/23 1729 (!) 59     Resp 09/25/23 1729 20     Temp 09/25/23 1729 97.8 F (36.6 C)     Temp Source 09/25/23 1729 Oral     SpO2 09/25/23 1729 97 %  Weight --      Height --      Head Circumference --      Peak Flow --      Pain Score 09/25/23 1730 0     Pain Loc --      Pain Education --      Exclude from Growth Chart --    No data found.  Updated Vital Signs BP 118/82 (BP Location: Right Arm)   Pulse (!) 59   Temp 97.8 F (36.6 C) (Oral)   Resp 20   SpO2 97%   Visual Acuity Right Eye Distance:   Left Eye Distance:   Bilateral Distance:    Right Eye Near:   Left Eye Near:    Bilateral Near:     Physical Exam Constitutional:      General: She is not in acute distress.    Appearance: Normal appearance. She is not ill-appearing or toxic-appearing.  HENT:     Right Ear: Tympanic membrane and ear canal normal.     Left Ear: Tympanic membrane and ear canal normal.     Mouth/Throat:     Pharynx: Oropharynx is clear.  Eyes:     Conjunctiva/sclera: Conjunctivae normal.  Cardiovascular:     Rate and Rhythm: Normal rate and regular rhythm.     Pulses:  Normal pulses.     Heart sounds: Normal heart sounds.  Pulmonary:     Breath sounds: Wheezing and rales present.     Comments: Mild wheezing and rales to left lower lobe Skin:    General: Skin is warm and dry.  Neurological:     Mental Status: She is alert.  Psychiatric:        Mood and Affect: Mood normal.      UC Treatments / Results  Labs (all labs ordered are listed, but only abnormal results are displayed) Labs Reviewed - No data to display  EKG   Radiology No results found.  Procedures Procedures (including critical care time)  Medications Ordered in UC Medications - No data to display  Initial Impression / Assessment and Plan / UC Course  I have reviewed the triage vital signs and the nursing notes.  Pertinent labs & imaging results that were available during my care of the patient were reviewed by me and considered in my medical decision making (see chart for details).     Lower respiratory tract infection-treating for potential pneumonia based on lung sounds and past medical history. Antibiotics as prescribed. Recommend Mucinex for congestion  Rash-most likely contact dermatitis.  Recommend she can take Benadryl for itching as needed triamcinolone cream to the area. Recommended switch out her soap and laundry to urgent back to what she was previously using. Final Clinical Impressions(s) / UC Diagnoses   Final diagnoses:  Rash and nonspecific skin eruption  Lower respiratory tract infection     Discharge Instructions      Treating for possible pnuemonia.  Take the antibiotics as prescribed.  Also given you a steroid cream to help with the bumps and itching.  You can take a daily allergy pill to help with the itching such as cetirizine or Zyrtec  follow-up for any continued or worsening issues    ED Prescriptions     Medication Sig Dispense Auth. Provider   azithromycin (ZITHROMAX) 250 MG tablet Take 1 tablet (250 mg total) by mouth daily. Take  first 2 tablets together, then 1 every day until finished. 6 tablet Janace Aris, FNP  triamcinolone cream (KENALOG) 0.1 % Apply 1 Application topically 2 (two) times daily. 30 g Janace Aris, FNP      PDMP not reviewed this encounter.   Janace Aris, FNP 09/26/23 1243

## 2024-05-14 ENCOUNTER — Other Ambulatory Visit: Payer: Self-pay | Admitting: Obstetrics and Gynecology

## 2024-05-14 DIAGNOSIS — Z1231 Encounter for screening mammogram for malignant neoplasm of breast: Secondary | ICD-10-CM

## 2024-06-05 ENCOUNTER — Ambulatory Visit
Admission: RE | Admit: 2024-06-05 | Discharge: 2024-06-05 | Disposition: A | Source: Ambulatory Visit | Attending: Obstetrics and Gynecology | Admitting: Obstetrics and Gynecology

## 2024-06-05 DIAGNOSIS — Z1231 Encounter for screening mammogram for malignant neoplasm of breast: Secondary | ICD-10-CM
